# Patient Record
Sex: Female | Born: 1950
Health system: Southern US, Community
[De-identification: ages and names within clinical notes are randomized; demographics above are authoritative.]

---

## 2007-07-24 ENCOUNTER — Other Ambulatory Visit: Admission: RE | Admit: 2007-07-24 | Discharge: 2007-07-24 | Payer: Self-pay | Admitting: Family Medicine

## 2007-08-30 ENCOUNTER — Encounter: Admission: RE | Admit: 2007-08-30 | Discharge: 2007-08-30 | Payer: Self-pay | Admitting: Family Medicine

## 2007-11-23 ENCOUNTER — Ambulatory Visit (HOSPITAL_COMMUNITY): Admission: RE | Admit: 2007-11-23 | Discharge: 2007-11-23 | Payer: Self-pay | Admitting: Pulmonary Disease

## 2008-08-07 ENCOUNTER — Other Ambulatory Visit: Admission: RE | Admit: 2008-08-07 | Discharge: 2008-08-07 | Payer: Self-pay | Admitting: Family Medicine

## 2008-09-02 ENCOUNTER — Encounter: Admission: RE | Admit: 2008-09-02 | Discharge: 2008-09-02 | Payer: Self-pay | Admitting: Family Medicine

## 2008-09-09 ENCOUNTER — Encounter: Admission: RE | Admit: 2008-09-09 | Discharge: 2008-09-09 | Payer: Self-pay | Admitting: Family Medicine

## 2009-01-01 ENCOUNTER — Emergency Department (HOSPITAL_COMMUNITY): Admission: EM | Admit: 2009-01-01 | Discharge: 2009-01-01 | Payer: Self-pay | Admitting: Emergency Medicine

## 2009-08-07 ENCOUNTER — Other Ambulatory Visit: Admission: RE | Admit: 2009-08-07 | Discharge: 2009-08-07 | Payer: Self-pay | Admitting: Family Medicine

## 2009-09-03 ENCOUNTER — Encounter: Admission: RE | Admit: 2009-09-03 | Discharge: 2009-09-03 | Payer: Self-pay | Admitting: Family Medicine

## 2010-10-27 ENCOUNTER — Encounter
Admission: RE | Admit: 2010-10-27 | Discharge: 2010-10-27 | Payer: Self-pay | Source: Home / Self Care | Attending: Family Medicine | Admitting: Family Medicine

## 2010-11-08 ENCOUNTER — Encounter: Payer: Self-pay | Admitting: Family Medicine

## 2011-01-28 LAB — BASIC METABOLIC PANEL
BUN: 24 mg/dL — ABNORMAL HIGH (ref 6–23)
Chloride: 103 mEq/L (ref 96–112)
Sodium: 139 mEq/L (ref 135–145)

## 2011-01-28 LAB — DIFFERENTIAL
Eosinophils Absolute: 0.1 10*3/uL (ref 0.0–0.7)
Lymphocytes Relative: 5 % — ABNORMAL LOW (ref 12–46)
Monocytes Absolute: 0.4 10*3/uL (ref 0.1–1.0)
Neutro Abs: 13.3 10*3/uL — ABNORMAL HIGH (ref 1.7–7.7)
Neutrophils Relative %: 92 % — ABNORMAL HIGH (ref 43–77)

## 2011-01-28 LAB — CBC
HCT: 43.8 % (ref 36.0–46.0)
Hemoglobin: 15.1 g/dL — ABNORMAL HIGH (ref 12.0–15.0)
MCHC: 34.5 g/dL (ref 30.0–36.0)
MCV: 91 fL (ref 78.0–100.0)
Platelets: 234 10*3/uL (ref 150–400)
RDW: 13.1 % (ref 11.5–15.5)

## 2011-07-28 ENCOUNTER — Other Ambulatory Visit (HOSPITAL_COMMUNITY)
Admission: RE | Admit: 2011-07-28 | Discharge: 2011-07-28 | Disposition: A | Discharge status: Home / Self Care | Payer: BC Managed Care – PPO | Source: Ambulatory Visit | Attending: Family Medicine | Admitting: Family Medicine

## 2011-07-28 DIAGNOSIS — Z124 Encounter for screening for malignant neoplasm of cervix: Secondary | ICD-10-CM | POA: Insufficient documentation

## 2011-11-18 ENCOUNTER — Other Ambulatory Visit: Payer: Self-pay | Admitting: Family Medicine

## 2011-11-18 DIAGNOSIS — Z1231 Encounter for screening mammogram for malignant neoplasm of breast: Secondary | ICD-10-CM

## 2011-11-23 ENCOUNTER — Ambulatory Visit
Admission: RE | Admit: 2011-11-23 | Discharge: 2011-11-23 | Disposition: A | Discharge status: Home / Self Care | Payer: BC Managed Care – PPO | Source: Ambulatory Visit | Attending: Family Medicine | Admitting: Family Medicine

## 2011-11-23 DIAGNOSIS — Z1231 Encounter for screening mammogram for malignant neoplasm of breast: Secondary | ICD-10-CM

## 2012-11-17 ENCOUNTER — Other Ambulatory Visit: Payer: Self-pay | Admitting: Family Medicine

## 2012-11-17 DIAGNOSIS — Z1231 Encounter for screening mammogram for malignant neoplasm of breast: Secondary | ICD-10-CM

## 2012-12-18 ENCOUNTER — Ambulatory Visit
Admission: RE | Admit: 2012-12-18 | Discharge: 2012-12-18 | Disposition: A | Discharge status: Home / Self Care | Payer: BC Managed Care – PPO | Source: Ambulatory Visit | Attending: Family Medicine | Admitting: Family Medicine

## 2012-12-18 DIAGNOSIS — Z1231 Encounter for screening mammogram for malignant neoplasm of breast: Secondary | ICD-10-CM

## 2013-12-24 ENCOUNTER — Other Ambulatory Visit: Payer: Self-pay

## 2013-12-24 DIAGNOSIS — Z1231 Encounter for screening mammogram for malignant neoplasm of breast: Secondary | ICD-10-CM

## 2014-01-03 ENCOUNTER — Ambulatory Visit: Payer: BC Managed Care – PPO

## 2014-02-07 ENCOUNTER — Ambulatory Visit
Admission: RE | Admit: 2014-02-07 | Discharge: 2014-02-07 | Disposition: A | Discharge status: Home / Self Care | Payer: Self-pay | Source: Ambulatory Visit

## 2014-02-07 ENCOUNTER — Encounter (INDEPENDENT_AMBULATORY_CARE_PROVIDER_SITE_OTHER): Payer: Self-pay

## 2014-02-07 DIAGNOSIS — Z1231 Encounter for screening mammogram for malignant neoplasm of breast: Secondary | ICD-10-CM

## 2014-08-23 ENCOUNTER — Other Ambulatory Visit: Payer: Self-pay | Admitting: Family Medicine

## 2014-08-23 ENCOUNTER — Other Ambulatory Visit (HOSPITAL_COMMUNITY)
Admission: RE | Admit: 2014-08-23 | Discharge: 2014-08-23 | Disposition: A | Discharge status: Home / Self Care | Payer: BC Managed Care – PPO | Source: Ambulatory Visit | Attending: Family Medicine | Admitting: Family Medicine

## 2014-08-23 DIAGNOSIS — Z1151 Encounter for screening for human papillomavirus (HPV): Secondary | ICD-10-CM | POA: Diagnosis present

## 2014-08-23 DIAGNOSIS — Z124 Encounter for screening for malignant neoplasm of cervix: Secondary | ICD-10-CM | POA: Diagnosis present

## 2014-08-27 LAB — CYTOLOGY - PAP

## 2014-12-19 ENCOUNTER — Other Ambulatory Visit: Payer: Self-pay

## 2014-12-19 DIAGNOSIS — Z1231 Encounter for screening mammogram for malignant neoplasm of breast: Secondary | ICD-10-CM

## 2015-02-10 ENCOUNTER — Ambulatory Visit
Admission: RE | Admit: 2015-02-10 | Discharge: 2015-02-10 | Disposition: A | Discharge status: Home / Self Care | Payer: BC Managed Care – PPO | Source: Ambulatory Visit

## 2015-02-10 DIAGNOSIS — Z1231 Encounter for screening mammogram for malignant neoplasm of breast: Secondary | ICD-10-CM

## 2015-04-23 ENCOUNTER — Other Ambulatory Visit: Payer: Self-pay | Admitting: Family Medicine

## 2015-04-25 LAB — CYTOLOGY - PAP

## 2015-05-05 ENCOUNTER — Other Ambulatory Visit: Payer: Self-pay | Admitting: Gastroenterology

## 2016-01-23 ENCOUNTER — Other Ambulatory Visit: Payer: Self-pay

## 2016-01-23 DIAGNOSIS — Z1231 Encounter for screening mammogram for malignant neoplasm of breast: Secondary | ICD-10-CM

## 2016-02-12 ENCOUNTER — Ambulatory Visit
Admission: RE | Admit: 2016-02-12 | Discharge: 2016-02-12 | Disposition: A | Discharge status: Home / Self Care | Payer: Medicare Other | Source: Ambulatory Visit

## 2016-02-12 DIAGNOSIS — Z1231 Encounter for screening mammogram for malignant neoplasm of breast: Secondary | ICD-10-CM

## 2016-03-08 ENCOUNTER — Other Ambulatory Visit: Payer: Self-pay | Admitting: Family Medicine

## 2016-03-08 ENCOUNTER — Other Ambulatory Visit (HOSPITAL_COMMUNITY)
Admission: RE | Admit: 2016-03-08 | Discharge: 2016-03-08 | Disposition: A | Discharge status: Home / Self Care | Payer: Medicare Other | Source: Ambulatory Visit | Attending: Family Medicine | Admitting: Family Medicine

## 2016-03-08 DIAGNOSIS — R896 Abnormal cytological findings in specimens from other organs, systems and tissues: Secondary | ICD-10-CM | POA: Diagnosis present

## 2016-03-10 LAB — CYTOLOGY - PAP

## 2017-01-27 ENCOUNTER — Other Ambulatory Visit: Payer: Self-pay | Admitting: Family Medicine

## 2017-01-27 DIAGNOSIS — Z1231 Encounter for screening mammogram for malignant neoplasm of breast: Secondary | ICD-10-CM

## 2017-02-18 ENCOUNTER — Ambulatory Visit: Payer: Medicare Other

## 2017-03-16 ENCOUNTER — Ambulatory Visit
Admission: RE | Admit: 2017-03-16 | Discharge: 2017-03-16 | Disposition: A | Discharge status: Home / Self Care | Payer: Medicare Other | Source: Ambulatory Visit | Attending: Family Medicine | Admitting: Family Medicine

## 2017-03-16 DIAGNOSIS — Z1231 Encounter for screening mammogram for malignant neoplasm of breast: Secondary | ICD-10-CM

## 2018-03-15 ENCOUNTER — Other Ambulatory Visit: Payer: Self-pay | Admitting: Family Medicine

## 2018-03-15 DIAGNOSIS — Z1231 Encounter for screening mammogram for malignant neoplasm of breast: Secondary | ICD-10-CM

## 2018-04-04 ENCOUNTER — Ambulatory Visit
Admission: RE | Admit: 2018-04-04 | Discharge: 2018-04-04 | Disposition: A | Discharge status: Home / Self Care | Payer: Medicare Other | Source: Ambulatory Visit | Attending: Family Medicine | Admitting: Family Medicine

## 2018-04-04 DIAGNOSIS — Z1231 Encounter for screening mammogram for malignant neoplasm of breast: Secondary | ICD-10-CM

## 2018-12-20 ENCOUNTER — Other Ambulatory Visit: Payer: Self-pay | Admitting: Family Medicine

## 2018-12-20 ENCOUNTER — Other Ambulatory Visit (HOSPITAL_COMMUNITY)
Admission: RE | Admit: 2018-12-20 | Discharge: 2018-12-20 | Disposition: A | Discharge status: Home / Self Care | Payer: Medicare Other | Source: Ambulatory Visit | Attending: Family Medicine | Admitting: Family Medicine

## 2018-12-20 DIAGNOSIS — R896 Abnormal cytological findings in specimens from other organs, systems and tissues: Secondary | ICD-10-CM | POA: Insufficient documentation

## 2018-12-20 DIAGNOSIS — M8580 Other specified disorders of bone density and structure, unspecified site: Secondary | ICD-10-CM

## 2018-12-25 LAB — CYTOLOGY - PAP
DIAGNOSIS: NEGATIVE
HPV: NOT DETECTED

## 2019-01-23 ENCOUNTER — Other Ambulatory Visit: Payer: Medicare Other

## 2019-04-25 ENCOUNTER — Other Ambulatory Visit: Payer: Self-pay | Admitting: Family Medicine

## 2019-04-25 DIAGNOSIS — Z1231 Encounter for screening mammogram for malignant neoplasm of breast: Secondary | ICD-10-CM

## 2019-05-08 ENCOUNTER — Other Ambulatory Visit: Payer: Medicare Other

## 2019-06-06 ENCOUNTER — Ambulatory Visit
Admission: RE | Admit: 2019-06-06 | Discharge: 2019-06-06 | Disposition: A | Discharge status: Home / Self Care | Payer: Medicare Other | Source: Ambulatory Visit | Attending: Family Medicine | Admitting: Family Medicine

## 2019-06-06 ENCOUNTER — Other Ambulatory Visit: Payer: Self-pay

## 2019-06-06 DIAGNOSIS — Z1231 Encounter for screening mammogram for malignant neoplasm of breast: Secondary | ICD-10-CM

## 2019-07-02 ENCOUNTER — Other Ambulatory Visit: Payer: Self-pay

## 2019-07-02 ENCOUNTER — Ambulatory Visit
Admission: RE | Admit: 2019-07-02 | Discharge: 2019-07-02 | Disposition: A | Discharge status: Home / Self Care | Payer: Medicare Other | Source: Ambulatory Visit | Attending: Family Medicine | Admitting: Family Medicine

## 2019-07-02 DIAGNOSIS — M8580 Other specified disorders of bone density and structure, unspecified site: Secondary | ICD-10-CM

## 2020-06-09 ENCOUNTER — Other Ambulatory Visit: Payer: Self-pay | Admitting: Family Medicine

## 2020-06-09 DIAGNOSIS — Z1231 Encounter for screening mammogram for malignant neoplasm of breast: Secondary | ICD-10-CM

## 2020-06-20 ENCOUNTER — Other Ambulatory Visit: Payer: Self-pay

## 2020-06-20 ENCOUNTER — Ambulatory Visit
Admission: RE | Admit: 2020-06-20 | Discharge: 2020-06-20 | Disposition: A | Discharge status: Home / Self Care | Payer: Medicare PPO | Source: Ambulatory Visit | Attending: Family Medicine | Admitting: Family Medicine

## 2020-06-20 DIAGNOSIS — Z1231 Encounter for screening mammogram for malignant neoplasm of breast: Secondary | ICD-10-CM | POA: Diagnosis not present

## 2020-07-11 DIAGNOSIS — M9901 Segmental and somatic dysfunction of cervical region: Secondary | ICD-10-CM | POA: Diagnosis not present

## 2020-07-11 DIAGNOSIS — M546 Pain in thoracic spine: Secondary | ICD-10-CM | POA: Diagnosis not present

## 2020-07-11 DIAGNOSIS — M9902 Segmental and somatic dysfunction of thoracic region: Secondary | ICD-10-CM | POA: Diagnosis not present

## 2020-07-11 DIAGNOSIS — M542 Cervicalgia: Secondary | ICD-10-CM | POA: Diagnosis not present

## 2020-09-25 ENCOUNTER — Other Ambulatory Visit: Payer: Self-pay

## 2020-09-25 ENCOUNTER — Encounter: Payer: Self-pay | Admitting: Emergency Medicine

## 2020-09-25 ENCOUNTER — Ambulatory Visit
Admission: EM | Admit: 2020-09-25 | Discharge: 2020-09-25 | Disposition: A | Discharge status: Home / Self Care | Payer: Medicare PPO | Attending: Emergency Medicine | Admitting: Emergency Medicine

## 2020-09-25 DIAGNOSIS — J069 Acute upper respiratory infection, unspecified: Secondary | ICD-10-CM

## 2020-09-25 DIAGNOSIS — J209 Acute bronchitis, unspecified: Secondary | ICD-10-CM

## 2020-09-25 MED ORDER — PREDNISONE 10 MG PO TABS: 20.0000 mg | ORAL_TABLET | Freq: Every day | ORAL | 0 refills | Status: AC

## 2020-09-25 MED ORDER — ALBUTEROL SULFATE HFA 108 (90 BASE) MCG/ACT IN AERS: 1.0000 | INHALATION_SPRAY | Freq: Four times a day (QID) | RESPIRATORY_TRACT | 0 refills | Status: AC | PRN

## 2020-09-25 MED ORDER — BENZONATATE 100 MG PO CAPS: 100.0000 mg | ORAL_CAPSULE | Freq: Three times a day (TID) | ORAL | 0 refills | Status: AC

## 2020-09-25 MED ORDER — AZITHROMYCIN 250 MG PO TABS: 250.0000 mg | ORAL_TABLET | Freq: Every day | ORAL | 0 refills | Status: AC

## 2020-09-25 NOTE — ED Provider Notes (Addendum)
[facility]ARE CENTER   [id number] 09/25/20 Arrival Time: 1630   Chief Complaint  Patient presents with  . Cough     SUBJECTIVE: History from: patient and family.  Melissa Eaton is a 69 y.o. female who presented to the urgent care with a complaint of cough, nasal congestion and headache for the past 1 week.  Report worsening cough.  Denies sick exposure to COVID, flu or strep.  Reported negative COVID-19 test via PCR at [facility] department.  Denies recent travel.  Has tried OTC medication without relief.  Denies any aggravating factors.  Denies previous symptoms in the past.   Denies fever, chills, fatigue, sinus pain, rhinorrhea, sore throat, SOB, wheezing, chest pain, nausea, changes in bowel or bladder habits.     ROS: As per HPI.  All other pertinent ROS negative.      History reviewed. No pertinent past medical history. History reviewed. No pertinent surgical history. No Known Allergies No current facility-administered medications on file prior to encounter.   No current outpatient medications on file prior to encounter.   Social History   Socioeconomic History  . Marital status: Married    Spouse name: Not on file  . Number of children: Not on file  . Years of education: Not on file  . Highest education level: Not on file  Occupational History  . Not on file  Tobacco Use  . Smoking status: Never Smoker  . Smokeless tobacco: Never Used  Substance and Sexual Activity  . Alcohol use: Never  . Drug use: Never  . Sexual activity: Not on file  Other Topics Concern  . Not on file  Social History Narrative  . Not on file   Social Determinants of Health   Financial Resource Strain: Not on file  Food Insecurity: Not on file  Transportation Needs: Not on file  Physical Activity: Not on file  Stress: Not on file  Social Connections: Not on file  Intimate Partner Violence: Not on file   Family History  Problem Relation Age of Onset  . Breast cancer  Mother   . Breast cancer Sister     OBJECTIVE:  Vitals:   09/25/20 1713  BP: 134/80  Pulse: 84  Resp: 16  Temp: 99 F (37.2 C)  SpO2: 97%     General appearance: alert; appears fatigued, but nontoxic; speaking in full sentences and tolerating own secretions HEENT: NCAT; Ears: EACs clear, TMs pearly gray; Eyes: PERRL.  EOM grossly intact. Sinuses: nontender; Nose: nares patent without rhinorrhea, Throat: oropharynx clear, tonsils non erythematous or enlarged, uvula midline  Neck: supple without LAD Lungs: unlabored respirations, symmetrical air entry; cough: moderate; no respiratory distress; CTAB Heart: regular rate and rhythm.  Radial pulses 2+ symmetrical bilaterally Skin: warm and dry Psychological: alert and cooperative; normal mood and affect  LABS:  No results found for this or any previous visit (from the past 24 hour(s)).   ASSESSMENT & PLAN:  1. URI with cough and congestion   2. Acute bronchitis, unspecified organism     Meds ordered this encounter  Medications  . benzonatate (TESSALON) 100 MG capsule    Sig: Take 1 capsule (100 mg total) by mouth every 8 (eight) hours.    Dispense:  30 capsule    Refill:  0  . predniSONE (DELTASONE) 10 MG tablet    Sig: Take 2 tablets (20 mg total) by mouth daily.    Dispense:  15 tablet    Refill:  0  .  albuterol (VENTOLIN HFA) 108 (90 Base) MCG/ACT inhaler    Sig: Inhale 1-2 puffs into the lungs every 6 (six) hours as needed for wheezing or shortness of breath.    Dispense:  18 g    Refill:  0  . azithromycin (ZITHROMAX) 250 MG tablet    Sig: Take 1 tablet (250 mg total) by mouth daily. Take first 2 tablets together, then 1 every day until finished.    Dispense:  6 tablet    Refill:  0    Discharge instructions  Get plenty of rest and push fluids Tessalon Perles prescribed for cough/no more than 6 tabs in 24 hours Prednisone was prescribed ProAir was prescribed/take as directed Azithromycin was prescribed for  possible bronchitis Use medications daily for symptom relief Use OTC medications like ibuprofen or tylenol as needed fever or pain Call or go to the ED if you have any new or worsening symptoms such as fever, worsening cough, shortness of breath, chest tightness, chest pain, turning blue, changes in mental status, etc...   Reviewed expectations re: course of current medical issues. Questions answered. Outlined signs and symptoms indicating need for more acute intervention. Patient verbalized understanding. After Visit Summary given.         Durward Parcel, FNP 09/25/20 1738    Durward Parcel, FNP 09/25/20 1742

## 2020-09-25 NOTE — ED Triage Notes (Signed)
cough, headache sinus congestion x 1 week - had recent neg covid test

## 2020-09-25 NOTE — Discharge Instructions (Addendum)
Get plenty of rest and push fluids Tessalon Perles prescribed for cough/no more than 6 tabs in 24 hours Prednisone was prescribed ProAir was prescribed/take as directed Use medications daily for symptom relief Use OTC medications like ibuprofen or tylenol as needed fever or pain Call or go to the ED if you have any new or worsening symptoms such as fever, worsening cough, shortness of breath, chest tightness, chest pain, turning blue, changes in mental status, etc..Marland Kitchen

## 2020-11-26 DIAGNOSIS — M542 Cervicalgia: Secondary | ICD-10-CM | POA: Diagnosis not present

## 2020-11-26 DIAGNOSIS — M9902 Segmental and somatic dysfunction of thoracic region: Secondary | ICD-10-CM | POA: Diagnosis not present

## 2020-11-26 DIAGNOSIS — M546 Pain in thoracic spine: Secondary | ICD-10-CM | POA: Diagnosis not present

## 2020-11-26 DIAGNOSIS — M9901 Segmental and somatic dysfunction of cervical region: Secondary | ICD-10-CM | POA: Diagnosis not present

## 2021-02-06 DIAGNOSIS — M8588 Other specified disorders of bone density and structure, other site: Secondary | ICD-10-CM | POA: Diagnosis not present

## 2021-02-06 DIAGNOSIS — E7800 Pure hypercholesterolemia, unspecified: Secondary | ICD-10-CM | POA: Diagnosis not present

## 2021-02-06 DIAGNOSIS — Z86010 Personal history of colonic polyps: Secondary | ICD-10-CM | POA: Diagnosis not present

## 2021-02-06 DIAGNOSIS — Z0000 Encounter for general adult medical examination without abnormal findings: Secondary | ICD-10-CM | POA: Diagnosis not present

## 2021-02-06 DIAGNOSIS — K5190 Ulcerative colitis, unspecified, without complications: Secondary | ICD-10-CM | POA: Diagnosis not present

## 2021-02-06 DIAGNOSIS — Z1389 Encounter for screening for other disorder: Secondary | ICD-10-CM | POA: Diagnosis not present

## 2021-04-13 DIAGNOSIS — M542 Cervicalgia: Secondary | ICD-10-CM | POA: Diagnosis not present

## 2021-04-13 DIAGNOSIS — M546 Pain in thoracic spine: Secondary | ICD-10-CM | POA: Diagnosis not present

## 2021-04-13 DIAGNOSIS — M9902 Segmental and somatic dysfunction of thoracic region: Secondary | ICD-10-CM | POA: Diagnosis not present

## 2021-04-13 DIAGNOSIS — M9901 Segmental and somatic dysfunction of cervical region: Secondary | ICD-10-CM | POA: Diagnosis not present

## 2021-04-14 DIAGNOSIS — Z86010 Personal history of colonic polyps: Secondary | ICD-10-CM | POA: Diagnosis not present

## 2021-04-14 DIAGNOSIS — K5150 Left sided colitis without complications: Secondary | ICD-10-CM | POA: Diagnosis not present

## 2021-04-17 DIAGNOSIS — Z86010 Personal history of colonic polyps: Secondary | ICD-10-CM | POA: Diagnosis not present

## 2021-04-17 DIAGNOSIS — K5150 Left sided colitis without complications: Secondary | ICD-10-CM | POA: Diagnosis not present

## 2021-05-08 DIAGNOSIS — M546 Pain in thoracic spine: Secondary | ICD-10-CM | POA: Diagnosis not present

## 2021-05-08 DIAGNOSIS — M9902 Segmental and somatic dysfunction of thoracic region: Secondary | ICD-10-CM | POA: Diagnosis not present

## 2021-05-08 DIAGNOSIS — M9901 Segmental and somatic dysfunction of cervical region: Secondary | ICD-10-CM | POA: Diagnosis not present

## 2021-05-08 DIAGNOSIS — M542 Cervicalgia: Secondary | ICD-10-CM | POA: Diagnosis not present

## 2021-06-25 ENCOUNTER — Other Ambulatory Visit: Payer: Self-pay | Admitting: Family Medicine

## 2021-06-25 DIAGNOSIS — Z1231 Encounter for screening mammogram for malignant neoplasm of breast: Secondary | ICD-10-CM

## 2021-06-26 DIAGNOSIS — M9901 Segmental and somatic dysfunction of cervical region: Secondary | ICD-10-CM | POA: Diagnosis not present

## 2021-06-26 DIAGNOSIS — M9902 Segmental and somatic dysfunction of thoracic region: Secondary | ICD-10-CM | POA: Diagnosis not present

## 2021-06-26 DIAGNOSIS — M542 Cervicalgia: Secondary | ICD-10-CM | POA: Diagnosis not present

## 2021-06-26 DIAGNOSIS — M546 Pain in thoracic spine: Secondary | ICD-10-CM | POA: Diagnosis not present

## 2021-07-31 ENCOUNTER — Other Ambulatory Visit: Payer: Self-pay

## 2021-07-31 ENCOUNTER — Ambulatory Visit
Admission: RE | Admit: 2021-07-31 | Discharge: 2021-07-31 | Disposition: A | Discharge status: Home / Self Care | Payer: Medicare PPO | Source: Ambulatory Visit | Attending: Family Medicine | Admitting: Family Medicine

## 2021-07-31 DIAGNOSIS — Z1231 Encounter for screening mammogram for malignant neoplasm of breast: Secondary | ICD-10-CM | POA: Diagnosis not present

## 2021-11-16 DIAGNOSIS — M9902 Segmental and somatic dysfunction of thoracic region: Secondary | ICD-10-CM | POA: Diagnosis not present

## 2021-11-16 DIAGNOSIS — M9901 Segmental and somatic dysfunction of cervical region: Secondary | ICD-10-CM | POA: Diagnosis not present

## 2021-11-16 DIAGNOSIS — M542 Cervicalgia: Secondary | ICD-10-CM | POA: Diagnosis not present

## 2021-11-16 DIAGNOSIS — M546 Pain in thoracic spine: Secondary | ICD-10-CM | POA: Diagnosis not present

## 2022-01-15 DIAGNOSIS — M546 Pain in thoracic spine: Secondary | ICD-10-CM | POA: Diagnosis not present

## 2022-01-15 DIAGNOSIS — M542 Cervicalgia: Secondary | ICD-10-CM | POA: Diagnosis not present

## 2022-01-15 DIAGNOSIS — M9901 Segmental and somatic dysfunction of cervical region: Secondary | ICD-10-CM | POA: Diagnosis not present

## 2022-01-15 DIAGNOSIS — M9902 Segmental and somatic dysfunction of thoracic region: Secondary | ICD-10-CM | POA: Diagnosis not present

## 2022-02-12 DIAGNOSIS — M546 Pain in thoracic spine: Secondary | ICD-10-CM | POA: Diagnosis not present

## 2022-02-12 DIAGNOSIS — M542 Cervicalgia: Secondary | ICD-10-CM | POA: Diagnosis not present

## 2022-02-12 DIAGNOSIS — M9902 Segmental and somatic dysfunction of thoracic region: Secondary | ICD-10-CM | POA: Diagnosis not present

## 2022-02-12 DIAGNOSIS — M9901 Segmental and somatic dysfunction of cervical region: Secondary | ICD-10-CM | POA: Diagnosis not present

## 2022-02-23 ENCOUNTER — Other Ambulatory Visit: Payer: Self-pay | Admitting: Family Medicine

## 2022-02-23 DIAGNOSIS — K5190 Ulcerative colitis, unspecified, without complications: Secondary | ICD-10-CM | POA: Diagnosis not present

## 2022-02-23 DIAGNOSIS — M810 Age-related osteoporosis without current pathological fracture: Secondary | ICD-10-CM

## 2022-02-23 DIAGNOSIS — Z86010 Personal history of colonic polyps: Secondary | ICD-10-CM | POA: Diagnosis not present

## 2022-02-23 DIAGNOSIS — Z1231 Encounter for screening mammogram for malignant neoplasm of breast: Secondary | ICD-10-CM

## 2022-02-23 DIAGNOSIS — E7800 Pure hypercholesterolemia, unspecified: Secondary | ICD-10-CM | POA: Diagnosis not present

## 2022-02-23 DIAGNOSIS — Z0000 Encounter for general adult medical examination without abnormal findings: Secondary | ICD-10-CM | POA: Diagnosis not present

## 2022-02-23 DIAGNOSIS — M8588 Other specified disorders of bone density and structure, other site: Secondary | ICD-10-CM | POA: Diagnosis not present

## 2022-02-23 DIAGNOSIS — Z1331 Encounter for screening for depression: Secondary | ICD-10-CM | POA: Diagnosis not present

## 2022-07-23 DIAGNOSIS — M542 Cervicalgia: Secondary | ICD-10-CM | POA: Diagnosis not present

## 2022-07-23 DIAGNOSIS — M9903 Segmental and somatic dysfunction of lumbar region: Secondary | ICD-10-CM | POA: Diagnosis not present

## 2022-07-23 DIAGNOSIS — M546 Pain in thoracic spine: Secondary | ICD-10-CM | POA: Diagnosis not present

## 2022-07-23 DIAGNOSIS — M9901 Segmental and somatic dysfunction of cervical region: Secondary | ICD-10-CM | POA: Diagnosis not present

## 2022-07-23 DIAGNOSIS — M9902 Segmental and somatic dysfunction of thoracic region: Secondary | ICD-10-CM | POA: Diagnosis not present

## 2022-07-27 IMAGING — MG DIGITAL SCREENING BILAT W/ TOMO W/ CAD
8 series · 9 of 24 positions shown · non-contrast
Comparison: Previous exam(s).

CLINICAL DATA: Screening.

EXAM:
DIGITAL SCREENING BILATERAL MAMMOGRAM WITH TOMO AND CAD

[R CC synth-2D]
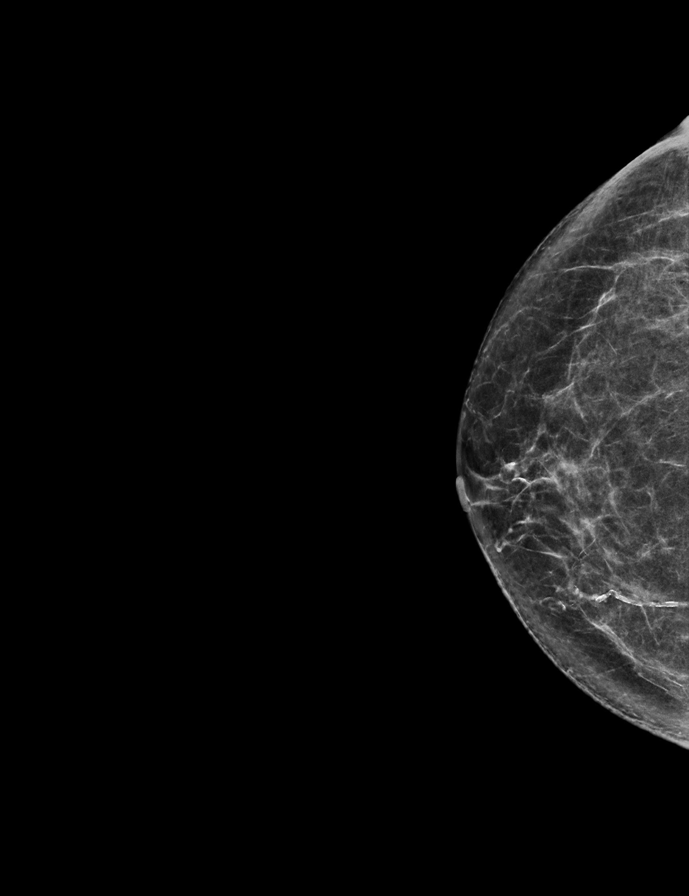

[L MLO synth-2D]
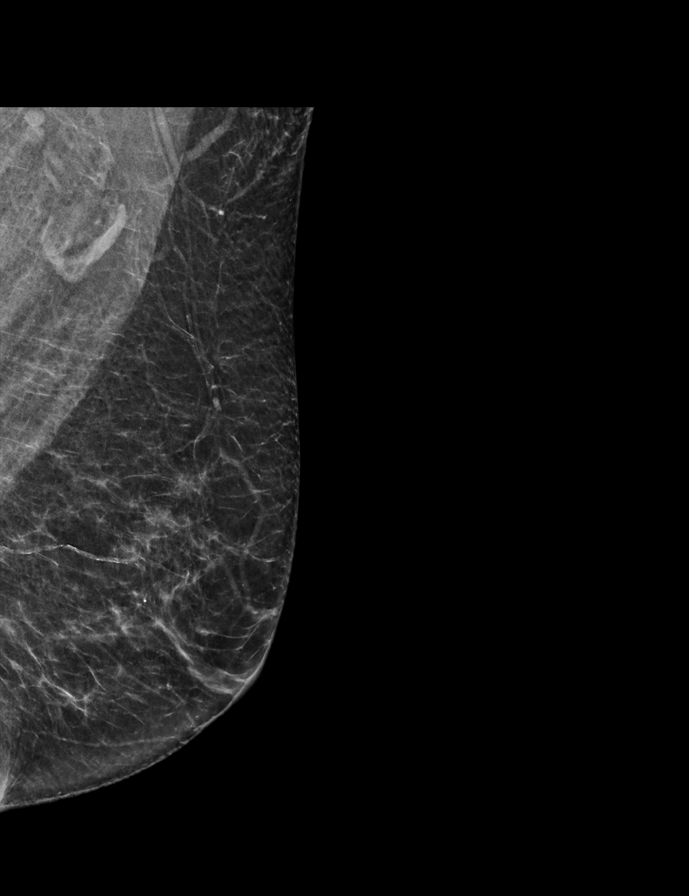

[R MLO synth-2D]
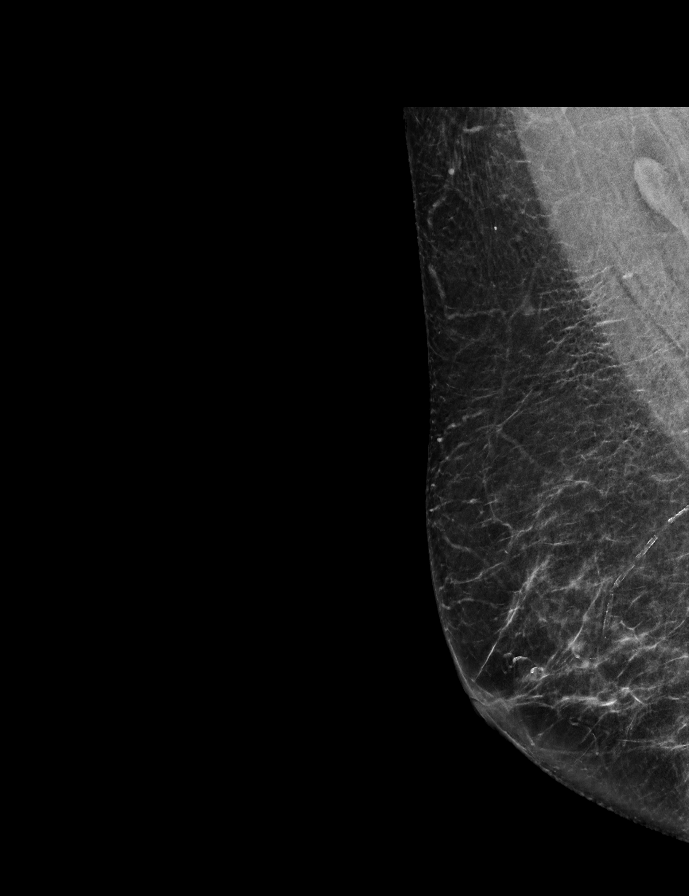

[L CC synth-2D]
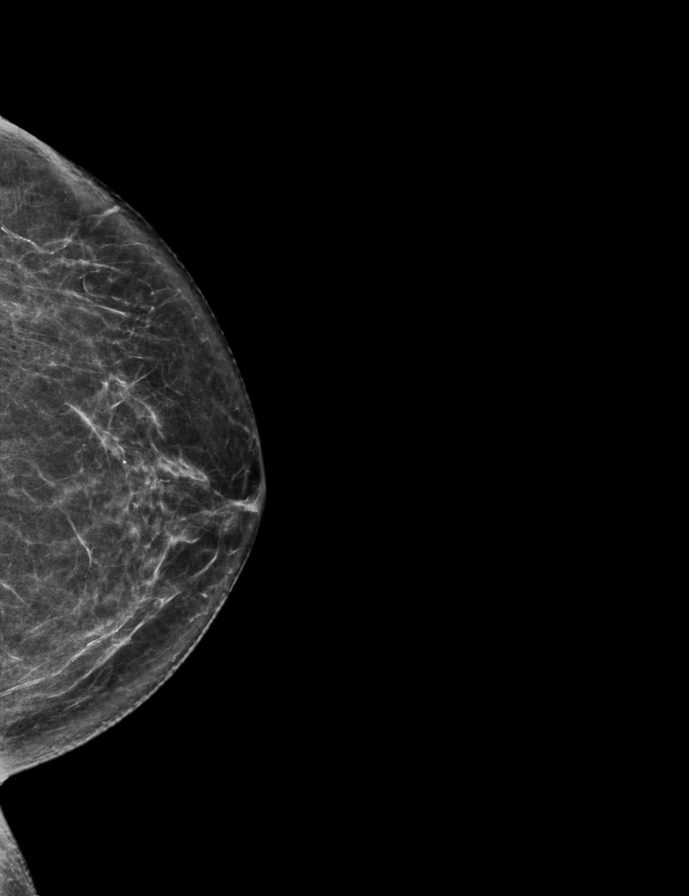

[L CC tomo · 2 of 62 frames shown]
[frame 21/62]
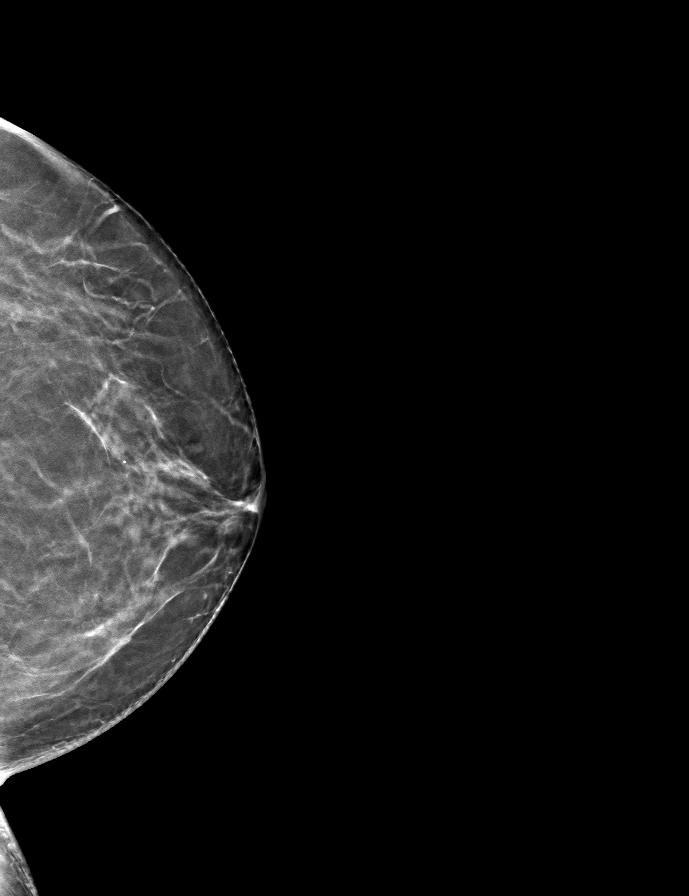
[frame 31/62]
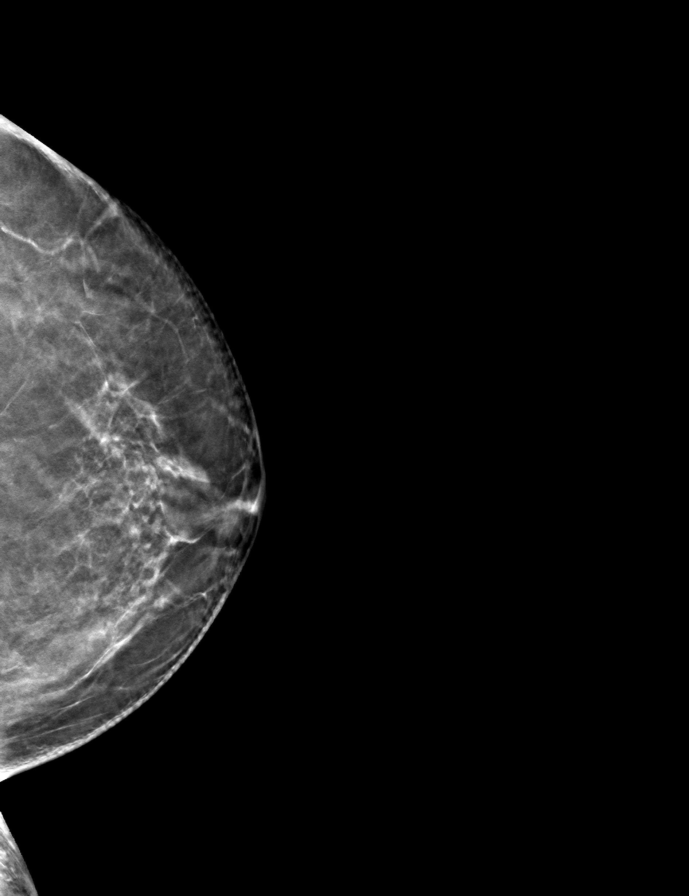

[R MLO tomo · tomo slice 31/62.0]
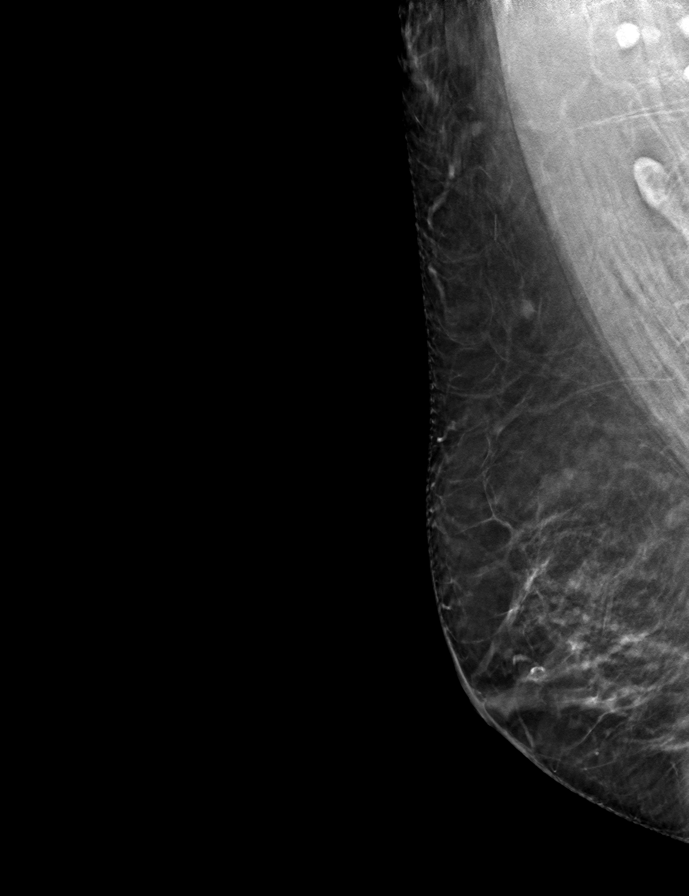

[R CC tomo · tomo slice 32/63.0]
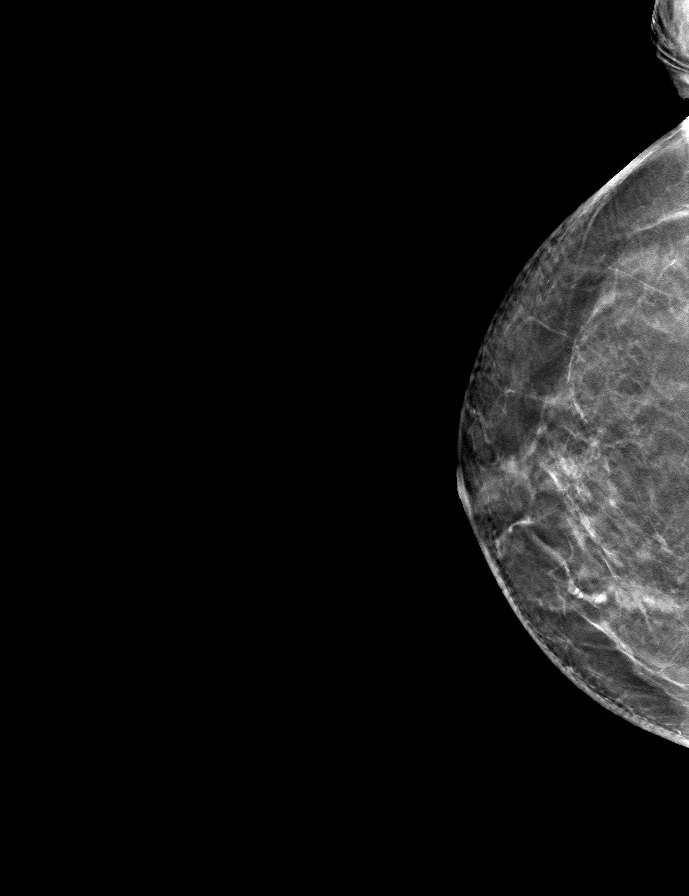

[L MLO tomo · tomo slice 31/62.0]
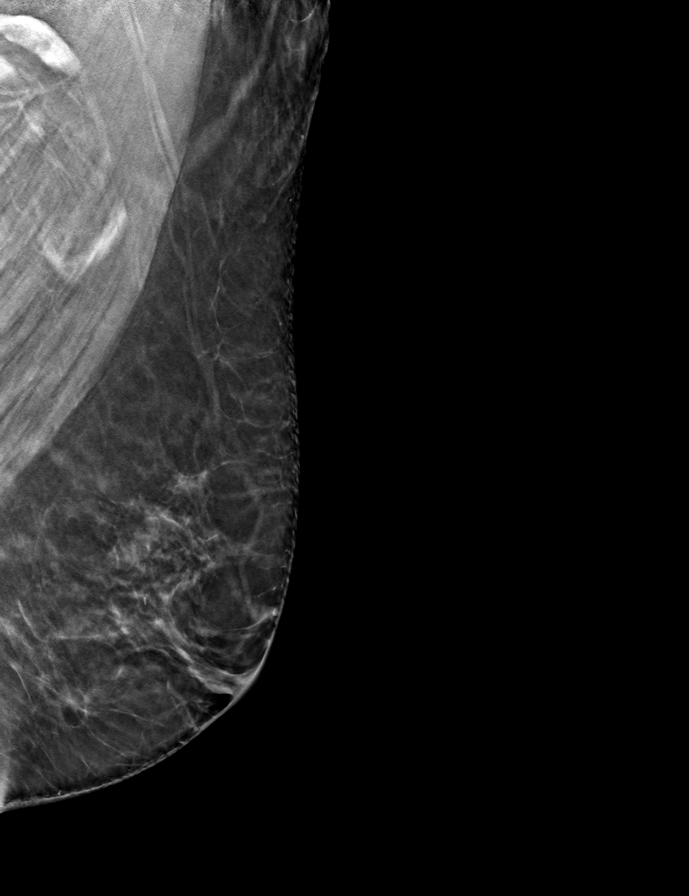

[9 of 24 positions shown; findings below may reference images not displayed]

ACR Breast Density Category b: There are scattered areas of
fibroglandular density.
FINDINGS: There are no findings suspicious for malignancy. Images were
processed with CAD.
IMPRESSION: No mammographic evidence of malignancy. A result letter of this
screening mammogram will be mailed directly to the patient.

RECOMMENDATION:
Screening mammogram in one year. (Code:[id number])

BI-RADS CATEGORY  1: Negative.

## 2022-08-11 ENCOUNTER — Ambulatory Visit: Payer: Medicare PPO

## 2022-08-11 ENCOUNTER — Other Ambulatory Visit: Payer: Medicare PPO

## 2022-08-31 DIAGNOSIS — E7800 Pure hypercholesterolemia, unspecified: Secondary | ICD-10-CM | POA: Diagnosis not present

## 2022-09-21 ENCOUNTER — Ambulatory Visit
Admission: RE | Admit: 2022-09-21 | Discharge: 2022-09-21 | Disposition: A | Discharge status: Home / Self Care | Payer: Medicare PPO | Source: Ambulatory Visit | Attending: Family Medicine | Admitting: Family Medicine

## 2022-09-21 DIAGNOSIS — Z1231 Encounter for screening mammogram for malignant neoplasm of breast: Secondary | ICD-10-CM | POA: Diagnosis not present

## 2022-09-21 DIAGNOSIS — M810 Age-related osteoporosis without current pathological fracture: Secondary | ICD-10-CM

## 2022-09-21 DIAGNOSIS — M8588 Other specified disorders of bone density and structure, other site: Secondary | ICD-10-CM | POA: Diagnosis not present

## 2022-09-21 DIAGNOSIS — Z780 Asymptomatic menopausal state: Secondary | ICD-10-CM | POA: Diagnosis not present

## 2022-10-01 DIAGNOSIS — Z803 Family history of malignant neoplasm of breast: Secondary | ICD-10-CM | POA: Diagnosis not present

## 2022-10-01 DIAGNOSIS — K5190 Ulcerative colitis, unspecified, without complications: Secondary | ICD-10-CM | POA: Diagnosis not present

## 2022-10-01 DIAGNOSIS — M810 Age-related osteoporosis without current pathological fracture: Secondary | ICD-10-CM | POA: Diagnosis not present

## 2023-02-25 DIAGNOSIS — M1990 Unspecified osteoarthritis, unspecified site: Secondary | ICD-10-CM | POA: Diagnosis not present

## 2023-02-25 DIAGNOSIS — E7800 Pure hypercholesterolemia, unspecified: Secondary | ICD-10-CM | POA: Diagnosis not present

## 2023-02-25 DIAGNOSIS — M8588 Other specified disorders of bone density and structure, other site: Secondary | ICD-10-CM | POA: Diagnosis not present

## 2023-02-25 DIAGNOSIS — M810 Age-related osteoporosis without current pathological fracture: Secondary | ICD-10-CM | POA: Diagnosis not present

## 2023-02-25 DIAGNOSIS — Z0000 Encounter for general adult medical examination without abnormal findings: Secondary | ICD-10-CM | POA: Diagnosis not present

## 2023-02-25 DIAGNOSIS — K5150 Left sided colitis without complications: Secondary | ICD-10-CM | POA: Diagnosis not present

## 2023-02-25 DIAGNOSIS — K5190 Ulcerative colitis, unspecified, without complications: Secondary | ICD-10-CM | POA: Diagnosis not present

## 2023-04-27 DIAGNOSIS — M9901 Segmental and somatic dysfunction of cervical region: Secondary | ICD-10-CM | POA: Diagnosis not present

## 2023-04-27 DIAGNOSIS — M9902 Segmental and somatic dysfunction of thoracic region: Secondary | ICD-10-CM | POA: Diagnosis not present

## 2023-04-27 DIAGNOSIS — M9903 Segmental and somatic dysfunction of lumbar region: Secondary | ICD-10-CM | POA: Diagnosis not present

## 2023-04-27 DIAGNOSIS — M546 Pain in thoracic spine: Secondary | ICD-10-CM | POA: Diagnosis not present

## 2023-04-27 DIAGNOSIS — M5442 Lumbago with sciatica, left side: Secondary | ICD-10-CM | POA: Diagnosis not present

## 2023-04-29 DIAGNOSIS — M9903 Segmental and somatic dysfunction of lumbar region: Secondary | ICD-10-CM | POA: Diagnosis not present

## 2023-04-29 DIAGNOSIS — M9901 Segmental and somatic dysfunction of cervical region: Secondary | ICD-10-CM | POA: Diagnosis not present

## 2023-04-29 DIAGNOSIS — M5442 Lumbago with sciatica, left side: Secondary | ICD-10-CM | POA: Diagnosis not present

## 2023-04-29 DIAGNOSIS — M546 Pain in thoracic spine: Secondary | ICD-10-CM | POA: Diagnosis not present

## 2023-04-29 DIAGNOSIS — M9902 Segmental and somatic dysfunction of thoracic region: Secondary | ICD-10-CM | POA: Diagnosis not present

## 2023-05-04 DIAGNOSIS — M5442 Lumbago with sciatica, left side: Secondary | ICD-10-CM | POA: Diagnosis not present

## 2023-05-04 DIAGNOSIS — M9901 Segmental and somatic dysfunction of cervical region: Secondary | ICD-10-CM | POA: Diagnosis not present

## 2023-05-04 DIAGNOSIS — M9903 Segmental and somatic dysfunction of lumbar region: Secondary | ICD-10-CM | POA: Diagnosis not present

## 2023-05-04 DIAGNOSIS — M546 Pain in thoracic spine: Secondary | ICD-10-CM | POA: Diagnosis not present

## 2023-05-04 DIAGNOSIS — M9902 Segmental and somatic dysfunction of thoracic region: Secondary | ICD-10-CM | POA: Diagnosis not present

## 2023-07-04 DIAGNOSIS — M546 Pain in thoracic spine: Secondary | ICD-10-CM | POA: Diagnosis not present

## 2023-07-04 DIAGNOSIS — M9902 Segmental and somatic dysfunction of thoracic region: Secondary | ICD-10-CM | POA: Diagnosis not present

## 2023-07-04 DIAGNOSIS — M5442 Lumbago with sciatica, left side: Secondary | ICD-10-CM | POA: Diagnosis not present

## 2023-07-04 DIAGNOSIS — M9901 Segmental and somatic dysfunction of cervical region: Secondary | ICD-10-CM | POA: Diagnosis not present

## 2023-07-04 DIAGNOSIS — M9903 Segmental and somatic dysfunction of lumbar region: Secondary | ICD-10-CM | POA: Diagnosis not present

## 2023-07-14 DIAGNOSIS — M5442 Lumbago with sciatica, left side: Secondary | ICD-10-CM | POA: Diagnosis not present

## 2023-07-14 DIAGNOSIS — M9903 Segmental and somatic dysfunction of lumbar region: Secondary | ICD-10-CM | POA: Diagnosis not present

## 2023-07-14 DIAGNOSIS — M9902 Segmental and somatic dysfunction of thoracic region: Secondary | ICD-10-CM | POA: Diagnosis not present

## 2023-07-14 DIAGNOSIS — M9901 Segmental and somatic dysfunction of cervical region: Secondary | ICD-10-CM | POA: Diagnosis not present

## 2023-07-14 DIAGNOSIS — M546 Pain in thoracic spine: Secondary | ICD-10-CM | POA: Diagnosis not present

## 2023-08-31 ENCOUNTER — Encounter: Payer: Self-pay | Admitting: Internal Medicine

## 2023-08-31 ENCOUNTER — Other Ambulatory Visit: Payer: Self-pay | Admitting: Family Medicine

## 2023-08-31 DIAGNOSIS — Z0000 Encounter for general adult medical examination without abnormal findings: Secondary | ICD-10-CM

## 2023-09-05 DIAGNOSIS — M810 Age-related osteoporosis without current pathological fracture: Secondary | ICD-10-CM | POA: Diagnosis not present

## 2023-09-05 DIAGNOSIS — Z23 Encounter for immunization: Secondary | ICD-10-CM | POA: Diagnosis not present

## 2023-09-05 DIAGNOSIS — R059 Cough, unspecified: Secondary | ICD-10-CM | POA: Diagnosis not present

## 2023-09-05 DIAGNOSIS — M859 Disorder of bone density and structure, unspecified: Secondary | ICD-10-CM | POA: Diagnosis not present

## 2023-09-05 DIAGNOSIS — K5190 Ulcerative colitis, unspecified, without complications: Secondary | ICD-10-CM | POA: Diagnosis not present

## 2023-09-05 DIAGNOSIS — E7800 Pure hypercholesterolemia, unspecified: Secondary | ICD-10-CM | POA: Diagnosis not present

## 2023-10-04 ENCOUNTER — Ambulatory Visit
Admission: RE | Admit: 2023-10-04 | Discharge: 2023-10-04 | Disposition: A | Discharge status: Home / Self Care | Payer: Medicare PPO | Source: Ambulatory Visit | Attending: Family Medicine | Admitting: Family Medicine

## 2023-10-04 DIAGNOSIS — Z1231 Encounter for screening mammogram for malignant neoplasm of breast: Secondary | ICD-10-CM | POA: Diagnosis not present

## 2023-10-04 DIAGNOSIS — Z0000 Encounter for general adult medical examination without abnormal findings: Secondary | ICD-10-CM

## 2023-11-09 DIAGNOSIS — M5442 Lumbago with sciatica, left side: Secondary | ICD-10-CM | POA: Diagnosis not present

## 2023-11-09 DIAGNOSIS — M9902 Segmental and somatic dysfunction of thoracic region: Secondary | ICD-10-CM | POA: Diagnosis not present

## 2023-11-09 DIAGNOSIS — M546 Pain in thoracic spine: Secondary | ICD-10-CM | POA: Diagnosis not present

## 2023-11-09 DIAGNOSIS — M9901 Segmental and somatic dysfunction of cervical region: Secondary | ICD-10-CM | POA: Diagnosis not present

## 2023-11-09 DIAGNOSIS — M9903 Segmental and somatic dysfunction of lumbar region: Secondary | ICD-10-CM | POA: Diagnosis not present

## 2024-01-09 DIAGNOSIS — M546 Pain in thoracic spine: Secondary | ICD-10-CM | POA: Diagnosis not present

## 2024-01-09 DIAGNOSIS — M9903 Segmental and somatic dysfunction of lumbar region: Secondary | ICD-10-CM | POA: Diagnosis not present

## 2024-01-09 DIAGNOSIS — M9902 Segmental and somatic dysfunction of thoracic region: Secondary | ICD-10-CM | POA: Diagnosis not present

## 2024-01-09 DIAGNOSIS — M9901 Segmental and somatic dysfunction of cervical region: Secondary | ICD-10-CM | POA: Diagnosis not present

## 2024-01-09 DIAGNOSIS — M542 Cervicalgia: Secondary | ICD-10-CM | POA: Diagnosis not present

## 2024-01-09 DIAGNOSIS — M62830 Muscle spasm of back: Secondary | ICD-10-CM | POA: Diagnosis not present

## 2024-02-13 DIAGNOSIS — M9903 Segmental and somatic dysfunction of lumbar region: Secondary | ICD-10-CM | POA: Diagnosis not present

## 2024-02-13 DIAGNOSIS — M9902 Segmental and somatic dysfunction of thoracic region: Secondary | ICD-10-CM | POA: Diagnosis not present

## 2024-02-13 DIAGNOSIS — M62830 Muscle spasm of back: Secondary | ICD-10-CM | POA: Diagnosis not present

## 2024-02-13 DIAGNOSIS — M9901 Segmental and somatic dysfunction of cervical region: Secondary | ICD-10-CM | POA: Diagnosis not present

## 2024-02-13 DIAGNOSIS — M546 Pain in thoracic spine: Secondary | ICD-10-CM | POA: Diagnosis not present

## 2024-02-13 DIAGNOSIS — M9905 Segmental and somatic dysfunction of pelvic region: Secondary | ICD-10-CM | POA: Diagnosis not present

## 2024-02-17 DIAGNOSIS — M62830 Muscle spasm of back: Secondary | ICD-10-CM | POA: Diagnosis not present

## 2024-02-17 DIAGNOSIS — M9905 Segmental and somatic dysfunction of pelvic region: Secondary | ICD-10-CM | POA: Diagnosis not present

## 2024-02-17 DIAGNOSIS — M9902 Segmental and somatic dysfunction of thoracic region: Secondary | ICD-10-CM | POA: Diagnosis not present

## 2024-02-17 DIAGNOSIS — M9901 Segmental and somatic dysfunction of cervical region: Secondary | ICD-10-CM | POA: Diagnosis not present

## 2024-02-17 DIAGNOSIS — M9903 Segmental and somatic dysfunction of lumbar region: Secondary | ICD-10-CM | POA: Diagnosis not present

## 2024-02-17 DIAGNOSIS — M546 Pain in thoracic spine: Secondary | ICD-10-CM | POA: Diagnosis not present

## 2024-02-21 DIAGNOSIS — M9901 Segmental and somatic dysfunction of cervical region: Secondary | ICD-10-CM | POA: Diagnosis not present

## 2024-02-21 DIAGNOSIS — M9902 Segmental and somatic dysfunction of thoracic region: Secondary | ICD-10-CM | POA: Diagnosis not present

## 2024-02-21 DIAGNOSIS — M62830 Muscle spasm of back: Secondary | ICD-10-CM | POA: Diagnosis not present

## 2024-02-21 DIAGNOSIS — M546 Pain in thoracic spine: Secondary | ICD-10-CM | POA: Diagnosis not present

## 2024-02-21 DIAGNOSIS — M9903 Segmental and somatic dysfunction of lumbar region: Secondary | ICD-10-CM | POA: Diagnosis not present

## 2024-02-21 DIAGNOSIS — M9905 Segmental and somatic dysfunction of pelvic region: Secondary | ICD-10-CM | POA: Diagnosis not present

## 2024-02-24 DIAGNOSIS — M62830 Muscle spasm of back: Secondary | ICD-10-CM | POA: Diagnosis not present

## 2024-02-24 DIAGNOSIS — M9902 Segmental and somatic dysfunction of thoracic region: Secondary | ICD-10-CM | POA: Diagnosis not present

## 2024-02-24 DIAGNOSIS — M9905 Segmental and somatic dysfunction of pelvic region: Secondary | ICD-10-CM | POA: Diagnosis not present

## 2024-02-24 DIAGNOSIS — M546 Pain in thoracic spine: Secondary | ICD-10-CM | POA: Diagnosis not present

## 2024-02-24 DIAGNOSIS — M9903 Segmental and somatic dysfunction of lumbar region: Secondary | ICD-10-CM | POA: Diagnosis not present

## 2024-02-24 DIAGNOSIS — M9901 Segmental and somatic dysfunction of cervical region: Secondary | ICD-10-CM | POA: Diagnosis not present

## 2024-02-27 DIAGNOSIS — M810 Age-related osteoporosis without current pathological fracture: Secondary | ICD-10-CM | POA: Diagnosis not present

## 2024-02-27 DIAGNOSIS — K5190 Ulcerative colitis, unspecified, without complications: Secondary | ICD-10-CM | POA: Diagnosis not present

## 2024-02-27 DIAGNOSIS — E7800 Pure hypercholesterolemia, unspecified: Secondary | ICD-10-CM | POA: Diagnosis not present

## 2024-02-29 DIAGNOSIS — M9903 Segmental and somatic dysfunction of lumbar region: Secondary | ICD-10-CM | POA: Diagnosis not present

## 2024-02-29 DIAGNOSIS — M9901 Segmental and somatic dysfunction of cervical region: Secondary | ICD-10-CM | POA: Diagnosis not present

## 2024-02-29 DIAGNOSIS — M62830 Muscle spasm of back: Secondary | ICD-10-CM | POA: Diagnosis not present

## 2024-02-29 DIAGNOSIS — M9905 Segmental and somatic dysfunction of pelvic region: Secondary | ICD-10-CM | POA: Diagnosis not present

## 2024-02-29 DIAGNOSIS — M9902 Segmental and somatic dysfunction of thoracic region: Secondary | ICD-10-CM | POA: Diagnosis not present

## 2024-02-29 DIAGNOSIS — M546 Pain in thoracic spine: Secondary | ICD-10-CM | POA: Diagnosis not present

## 2024-03-01 DIAGNOSIS — E7800 Pure hypercholesterolemia, unspecified: Secondary | ICD-10-CM | POA: Diagnosis not present

## 2024-03-01 DIAGNOSIS — M8588 Other specified disorders of bone density and structure, other site: Secondary | ICD-10-CM | POA: Diagnosis not present

## 2024-03-01 DIAGNOSIS — Z0000 Encounter for general adult medical examination without abnormal findings: Secondary | ICD-10-CM | POA: Diagnosis not present

## 2024-03-01 DIAGNOSIS — K5190 Ulcerative colitis, unspecified, without complications: Secondary | ICD-10-CM | POA: Diagnosis not present

## 2024-03-01 DIAGNOSIS — M810 Age-related osteoporosis without current pathological fracture: Secondary | ICD-10-CM | POA: Diagnosis not present

## 2024-03-02 DIAGNOSIS — M9903 Segmental and somatic dysfunction of lumbar region: Secondary | ICD-10-CM | POA: Diagnosis not present

## 2024-03-02 DIAGNOSIS — M9905 Segmental and somatic dysfunction of pelvic region: Secondary | ICD-10-CM | POA: Diagnosis not present

## 2024-03-02 DIAGNOSIS — M9901 Segmental and somatic dysfunction of cervical region: Secondary | ICD-10-CM | POA: Diagnosis not present

## 2024-03-02 DIAGNOSIS — M62830 Muscle spasm of back: Secondary | ICD-10-CM | POA: Diagnosis not present

## 2024-03-02 DIAGNOSIS — M9902 Segmental and somatic dysfunction of thoracic region: Secondary | ICD-10-CM | POA: Diagnosis not present

## 2024-03-02 DIAGNOSIS — M546 Pain in thoracic spine: Secondary | ICD-10-CM | POA: Diagnosis not present

## 2024-03-07 DIAGNOSIS — M9902 Segmental and somatic dysfunction of thoracic region: Secondary | ICD-10-CM | POA: Diagnosis not present

## 2024-03-07 DIAGNOSIS — M9905 Segmental and somatic dysfunction of pelvic region: Secondary | ICD-10-CM | POA: Diagnosis not present

## 2024-03-07 DIAGNOSIS — M9903 Segmental and somatic dysfunction of lumbar region: Secondary | ICD-10-CM | POA: Diagnosis not present

## 2024-03-07 DIAGNOSIS — M546 Pain in thoracic spine: Secondary | ICD-10-CM | POA: Diagnosis not present

## 2024-03-07 DIAGNOSIS — M9901 Segmental and somatic dysfunction of cervical region: Secondary | ICD-10-CM | POA: Diagnosis not present

## 2024-03-07 DIAGNOSIS — M62830 Muscle spasm of back: Secondary | ICD-10-CM | POA: Diagnosis not present

## 2024-03-21 DIAGNOSIS — M9901 Segmental and somatic dysfunction of cervical region: Secondary | ICD-10-CM | POA: Diagnosis not present

## 2024-03-21 DIAGNOSIS — M9905 Segmental and somatic dysfunction of pelvic region: Secondary | ICD-10-CM | POA: Diagnosis not present

## 2024-03-21 DIAGNOSIS — M62830 Muscle spasm of back: Secondary | ICD-10-CM | POA: Diagnosis not present

## 2024-03-21 DIAGNOSIS — M546 Pain in thoracic spine: Secondary | ICD-10-CM | POA: Diagnosis not present

## 2024-03-21 DIAGNOSIS — M9903 Segmental and somatic dysfunction of lumbar region: Secondary | ICD-10-CM | POA: Diagnosis not present

## 2024-03-21 DIAGNOSIS — M9902 Segmental and somatic dysfunction of thoracic region: Secondary | ICD-10-CM | POA: Diagnosis not present

## 2024-03-26 DIAGNOSIS — M9901 Segmental and somatic dysfunction of cervical region: Secondary | ICD-10-CM | POA: Diagnosis not present

## 2024-03-26 DIAGNOSIS — M546 Pain in thoracic spine: Secondary | ICD-10-CM | POA: Diagnosis not present

## 2024-03-26 DIAGNOSIS — M9903 Segmental and somatic dysfunction of lumbar region: Secondary | ICD-10-CM | POA: Diagnosis not present

## 2024-03-26 DIAGNOSIS — M9905 Segmental and somatic dysfunction of pelvic region: Secondary | ICD-10-CM | POA: Diagnosis not present

## 2024-03-26 DIAGNOSIS — M9902 Segmental and somatic dysfunction of thoracic region: Secondary | ICD-10-CM | POA: Diagnosis not present

## 2024-03-26 DIAGNOSIS — M62830 Muscle spasm of back: Secondary | ICD-10-CM | POA: Diagnosis not present

## 2024-04-04 DIAGNOSIS — M9905 Segmental and somatic dysfunction of pelvic region: Secondary | ICD-10-CM | POA: Diagnosis not present

## 2024-04-04 DIAGNOSIS — M9902 Segmental and somatic dysfunction of thoracic region: Secondary | ICD-10-CM | POA: Diagnosis not present

## 2024-04-04 DIAGNOSIS — M546 Pain in thoracic spine: Secondary | ICD-10-CM | POA: Diagnosis not present

## 2024-04-04 DIAGNOSIS — M9903 Segmental and somatic dysfunction of lumbar region: Secondary | ICD-10-CM | POA: Diagnosis not present

## 2024-04-04 DIAGNOSIS — M62830 Muscle spasm of back: Secondary | ICD-10-CM | POA: Diagnosis not present

## 2024-04-04 DIAGNOSIS — M9901 Segmental and somatic dysfunction of cervical region: Secondary | ICD-10-CM | POA: Diagnosis not present

## 2024-04-09 ENCOUNTER — Other Ambulatory Visit (HOSPITAL_COMMUNITY): Payer: Self-pay | Admitting: Family Medicine

## 2024-04-09 DIAGNOSIS — M810 Age-related osteoporosis without current pathological fracture: Secondary | ICD-10-CM

## 2024-04-23 DIAGNOSIS — M9902 Segmental and somatic dysfunction of thoracic region: Secondary | ICD-10-CM | POA: Diagnosis not present

## 2024-04-23 DIAGNOSIS — M9901 Segmental and somatic dysfunction of cervical region: Secondary | ICD-10-CM | POA: Diagnosis not present

## 2024-04-23 DIAGNOSIS — M9903 Segmental and somatic dysfunction of lumbar region: Secondary | ICD-10-CM | POA: Diagnosis not present

## 2024-04-23 DIAGNOSIS — M546 Pain in thoracic spine: Secondary | ICD-10-CM | POA: Diagnosis not present

## 2024-04-23 DIAGNOSIS — M62830 Muscle spasm of back: Secondary | ICD-10-CM | POA: Diagnosis not present

## 2024-04-23 DIAGNOSIS — M9905 Segmental and somatic dysfunction of pelvic region: Secondary | ICD-10-CM | POA: Diagnosis not present

## 2024-05-02 ENCOUNTER — Other Ambulatory Visit (INDEPENDENT_AMBULATORY_CARE_PROVIDER_SITE_OTHER): Payer: Self-pay

## 2024-05-02 ENCOUNTER — Encounter: Payer: Self-pay | Admitting: Orthopedic Surgery

## 2024-05-02 ENCOUNTER — Ambulatory Visit: Admitting: Orthopedic Surgery

## 2024-05-02 VITALS — BP 143/77 | HR 65 | Ht 63.0 in | Wt 146.0 lb

## 2024-05-02 DIAGNOSIS — M25552 Pain in left hip: Secondary | ICD-10-CM | POA: Diagnosis not present

## 2024-05-02 DIAGNOSIS — M7062 Trochanteric bursitis, left hip: Secondary | ICD-10-CM | POA: Diagnosis not present

## 2024-05-02 NOTE — Progress Notes (Signed)
 New Patient Visit  Assessment: Melissa Eaton is a 73 y.o. female with the following: 1. Greater trochanteric bursitis of left hip  Plan: JULE WHITSEL has pain over the lateral hip.  This is consistent with greater trochanteric bursitis.  She is taking appropriate medicines.  She is not interested in an injection.  She has been doing exercises, which has been improving her pain.  Provided reassurance.  All questions were answered.  Follow-up as needed.  Follow-up: Return if symptoms worsen or fail to improve.  Subjective:  Chief Complaint  Patient presents with   Hip Pain    L into groin and around the hip into glut since March. Pt states painmay have started from doing a lot of yard work. Has been seeing chiropractor 2-3 xwk. States pain would go away with treatments but as soon as she got home the pain was right back.     History of Present Illness: Melissa Eaton is a 73 y.o. female who has been referred by Don[place], [place] for evaluation of left hip pain.  She is having pain over the lateral aspect of the left hip, with some radiating pains into the groin.  It also radiates distally.  She reports that this has been ongoing for couple months.  When the weather started to improve, she started to do a lot of work in her yard.  Nothing specific happened, but she noted worsening pain since then.  She has had some difficulty with her motion, and unable to get her left leg straight.  She states that her pain has gotten much better.  She has been taking some ibuprofen and Tylenol.  She has been doing exercises from YouTube.  She also saw her chiropractor, but did not have any improvements.   Review of Systems: No fevers or chills No numbness or tingling No chest pain No shortness of breath No bowel or bladder dysfunction No GI distress No headaches   Medical History:  History reviewed. No pertinent past medical history.  History reviewed. No pertinent surgical  history.  Family History  Problem Relation Age of Onset   Breast cancer Mother    Breast cancer Sister    Social History   Tobacco Use   Smoking status: Never   Smokeless tobacco: Never  Substance Use Topics   Alcohol use: Never   Drug use: Never    Allergies  Allergen Reactions   Codeine Other (See Comments)   Salicylic Acid Nausea Only   Sulfa Antibiotics Rash    Current Meds  Medication Sig   albuterol  (VENTOLIN  HFA) 108 (90 Base) MCG/ACT inhaler Inhale 1-2 puffs into the lungs every 6 (six) hours as needed for wheezing or shortness of breath.   azithromycin  (ZITHROMAX ) 250 MG tablet Take 1 tablet (250 mg total) by mouth daily. Take first 2 tablets together, then 1 every day until finished.   benzonatate  (TESSALON ) 100 MG capsule Take 1 capsule (100 mg total) by mouth every 8 (eight) hours.   predniSONE  (DELTASONE ) 10 MG tablet Take 2 tablets (20 mg total) by mouth daily.    Objective: BP (!) 143/77   Pulse 65   Ht 5' 3 (1.6 m)   Wt 146 lb (66.2 kg)   BMI 25.86 kg/m   Physical Exam:  General: Elderly female. Gait: Normal gait.  No tenderness to palpation of lower back.  Negative straight leg raise bilaterally.  Tenderness palpation over the greater trochanter on the left side.  She has good  lower body strength.  Sensation is intact throughout bilateral lower extremities.  IMAGING: I personally ordered and reviewed the following images  Standing lumbar spine x-rays were obtained in clinic today.  No acute injuries are noted.  No anterolisthesis.  Well-maintained disc height.  Minimal degenerative changes.  No bony lesions.  Impression: Negative lumbar spine x-ray   AP pelvis and left hip x-rays were obtained in clinic today.  No acute injury is noted.  Well-maintained joint space.  No osteophytes.  No bony cysts.  No subchondral sclerosis.  No bony lesions.  Impression: Negative left hip x-ray   New Medications:  No orders of the defined types were  placed in this encounter.     Oneil DELENA Horde, MD  05/02/2024 9:03 AM

## 2024-08-27 ENCOUNTER — Other Ambulatory Visit (HOSPITAL_COMMUNITY): Payer: Self-pay | Admitting: Family Medicine

## 2024-08-27 DIAGNOSIS — Z1231 Encounter for screening mammogram for malignant neoplasm of breast: Secondary | ICD-10-CM

## 2024-09-03 DIAGNOSIS — E7800 Pure hypercholesterolemia, unspecified: Secondary | ICD-10-CM | POA: Diagnosis not present

## 2024-09-03 DIAGNOSIS — K5190 Ulcerative colitis, unspecified, without complications: Secondary | ICD-10-CM | POA: Diagnosis not present

## 2024-09-03 DIAGNOSIS — M810 Age-related osteoporosis without current pathological fracture: Secondary | ICD-10-CM | POA: Diagnosis not present

## 2024-09-04 DIAGNOSIS — E7800 Pure hypercholesterolemia, unspecified: Secondary | ICD-10-CM | POA: Diagnosis not present

## 2024-09-04 DIAGNOSIS — M810 Age-related osteoporosis without current pathological fracture: Secondary | ICD-10-CM | POA: Diagnosis not present

## 2024-09-04 DIAGNOSIS — K5190 Ulcerative colitis, unspecified, without complications: Secondary | ICD-10-CM | POA: Diagnosis not present

## 2024-09-04 DIAGNOSIS — Z23 Encounter for immunization: Secondary | ICD-10-CM | POA: Diagnosis not present

## 2024-10-05 ENCOUNTER — Other Ambulatory Visit (HOSPITAL_COMMUNITY)

## 2024-10-05 ENCOUNTER — Ambulatory Visit (HOSPITAL_COMMUNITY)

## 2024-10-12 ENCOUNTER — Ambulatory Visit (HOSPITAL_COMMUNITY)
Admission: RE | Admit: 2024-10-12 | Discharge: 2024-10-12 | Disposition: A | Discharge status: Home / Self Care | Source: Ambulatory Visit | Attending: Family Medicine | Admitting: Family Medicine

## 2024-10-12 ENCOUNTER — Encounter (HOSPITAL_COMMUNITY): Payer: Self-pay

## 2024-10-12 DIAGNOSIS — Z1231 Encounter for screening mammogram for malignant neoplasm of breast: Secondary | ICD-10-CM | POA: Insufficient documentation

## 2024-10-12 DIAGNOSIS — M810 Age-related osteoporosis without current pathological fracture: Secondary | ICD-10-CM | POA: Insufficient documentation
